# Patient Record
Sex: Female | Born: 1935 | Race: White | Hispanic: No
Health system: Southern US, Community
[De-identification: ages and names within clinical notes are randomized; demographics above are authoritative.]

---

## 2005-01-23 ENCOUNTER — Other Ambulatory Visit: Payer: Self-pay

## 2005-01-23 ENCOUNTER — Emergency Department: Payer: Self-pay | Admitting: Emergency Medicine

## 2005-04-12 ENCOUNTER — Emergency Department: Payer: Self-pay | Admitting: Emergency Medicine

## 2005-04-13 ENCOUNTER — Ambulatory Visit: Payer: Self-pay | Admitting: Emergency Medicine

## 2006-08-07 ENCOUNTER — Ambulatory Visit: Payer: Self-pay | Admitting: Family Medicine

## 2006-09-09 ENCOUNTER — Ambulatory Visit: Payer: Self-pay | Admitting: Family Medicine

## 2007-03-11 ENCOUNTER — Ambulatory Visit: Payer: Self-pay | Admitting: Family Medicine

## 2007-04-24 ENCOUNTER — Ambulatory Visit: Payer: Self-pay | Admitting: Family Medicine

## 2008-01-24 ENCOUNTER — Other Ambulatory Visit: Payer: Self-pay

## 2008-01-25 ENCOUNTER — Inpatient Hospital Stay: Payer: Self-pay | Admitting: Internal Medicine

## 2008-06-10 ENCOUNTER — Inpatient Hospital Stay: Payer: Self-pay | Admitting: Internal Medicine

## 2008-06-10 ENCOUNTER — Other Ambulatory Visit: Payer: Self-pay

## 2008-07-14 ENCOUNTER — Inpatient Hospital Stay: Payer: Self-pay | Admitting: Internal Medicine

## 2008-08-24 ENCOUNTER — Ambulatory Visit: Payer: Self-pay | Admitting: Internal Medicine

## 2010-04-10 ENCOUNTER — Inpatient Hospital Stay: Payer: Self-pay | Admitting: Internal Medicine

## 2010-05-29 ENCOUNTER — Emergency Department: Payer: Self-pay | Admitting: Emergency Medicine

## 2010-11-07 ENCOUNTER — Ambulatory Visit: Payer: Self-pay | Admitting: Family Medicine

## 2011-08-16 ENCOUNTER — Emergency Department: Payer: Self-pay | Admitting: Emergency Medicine

## 2011-09-03 ENCOUNTER — Ambulatory Visit: Payer: Self-pay | Admitting: Internal Medicine

## 2011-09-22 LAB — COMPREHENSIVE METABOLIC PANEL
Albumin: 3.3 g/dL — ABNORMAL LOW (ref 3.4–5.0)
Alkaline Phosphatase: 62 U/L (ref 50–136)
BUN: 30 mg/dL — ABNORMAL HIGH (ref 7–18)
Chloride: 103 mmol/L (ref 98–107)
EGFR (African American): 50 — ABNORMAL LOW
EGFR (Non-African Amer.): 41 — ABNORMAL LOW
Glucose: 99 mg/dL (ref 65–99)
Osmolality: 295 (ref 275–301)
Potassium: 3.5 mmol/L (ref 3.5–5.1)
SGOT(AST): 31 U/L (ref 15–37)
SGPT (ALT): 20 U/L
Sodium: 145 mmol/L (ref 136–145)
Total Protein: 6.7 g/dL (ref 6.4–8.2)

## 2011-09-22 LAB — URINALYSIS, COMPLETE
Bilirubin,UR: NEGATIVE
Glucose,UR: NEGATIVE mg/dL (ref 0–75)
Ketone: NEGATIVE
Nitrite: POSITIVE
RBC,UR: 4 /HPF (ref 0–5)
Squamous Epithelial: <1
WBC UR: 193 /HPF (ref 0–5)

## 2011-09-22 LAB — PROTIME-INR
INR: 1
Prothrombin Time: 13.9 secs (ref 11.5–14.7)

## 2011-09-22 LAB — CBC
HCT: 38.8 % (ref 35.0–47.0)
MCV: 94 fL (ref 80–100)
Platelet: 160 10*3/uL (ref 150–440)

## 2011-09-23 ENCOUNTER — Inpatient Hospital Stay: Payer: Self-pay | Admitting: *Deleted

## 2011-09-23 LAB — BASIC METABOLIC PANEL
BUN: 26 mg/dL — ABNORMAL HIGH (ref 7–18)
Chloride: 105 mmol/L (ref 98–107)
Co2: 30 mmol/L (ref 21–32)
EGFR (Non-African Amer.): 53 — ABNORMAL LOW
Glucose: 117 mg/dL — ABNORMAL HIGH (ref 65–99)
Osmolality: 294 (ref 275–301)
Potassium: 3.7 mmol/L (ref 3.5–5.1)

## 2011-09-23 LAB — TSH: Thyroid Stimulating Horm: 2.58 u[IU]/mL

## 2011-09-24 LAB — COMPREHENSIVE METABOLIC PANEL
Albumin: 2.8 g/dL — ABNORMAL LOW (ref 3.4–5.0)
Alkaline Phosphatase: 53 U/L (ref 50–136)
Anion Gap: 12 (ref 7–16)
Bilirubin,Total: 1.2 mg/dL — ABNORMAL HIGH (ref 0.2–1.0)
Calcium, Total: 9.9 mg/dL (ref 8.5–10.1)
Chloride: 107 mmol/L (ref 98–107)
Co2: 28 mmol/L (ref 21–32)
EGFR (African American): >60
EGFR (Non-African Amer.): 52 — ABNORMAL LOW
Glucose: 92 mg/dL (ref 65–99)
Potassium: 3.7 mmol/L (ref 3.5–5.1)
SGOT(AST): 29 U/L (ref 15–37)
SGPT (ALT): 17 U/L
Total Protein: 5.7 g/dL — ABNORMAL LOW (ref 6.4–8.2)

## 2011-09-24 LAB — URINE CULTURE

## 2011-09-24 LAB — CBC WITH DIFFERENTIAL/PLATELET
Basophil #: 0 10*3/uL (ref 0.0–0.1)
Basophil %: 0.2 %
Eosinophil #: 0 10*3/uL (ref 0.0–0.7)
HCT: 37.7 % (ref 35.0–47.0)
HGB: 12.4 g/dL (ref 12.0–16.0)
Lymphocyte #: 0.9 10*3/uL — ABNORMAL LOW (ref 1.0–3.6)
MCHC: 32.8 g/dL (ref 32.0–36.0)
MCV: 95 fL (ref 80–100)
Monocyte %: 12.7 %
Neutrophil #: 4.8 10*3/uL (ref 1.4–6.5)
RDW: 15.2 % — ABNORMAL HIGH (ref 11.5–14.5)
WBC: 6.6 10*3/uL (ref 3.6–11.0)

## 2011-09-28 LAB — CULTURE, BLOOD (SINGLE)

## 2011-10-04 ENCOUNTER — Ambulatory Visit: Payer: Self-pay | Admitting: Internal Medicine

## 2011-10-04 DEATH — deceased

## 2012-05-01 IMAGING — CT CT HEAD WITHOUT CONTRAST
2 of 4 series · 16 of 30 positions shown, 19 images · non-contrast
Comparison: none

REASON FOR EXAM: ams following trauma
COMMENTS:

PROCEDURE:     CT  - CT HEAD WITHOUT CONTRAST  - September 22, 2011  [DATE]
RESULT:     Comparison:  None
TECHNIQUE: Multiple axial images from the foramen magnum to the vertex were
obtained without IV contrast.

[Series 2: without · axial · non-contrast · 0.44mm/px · z∈[+322,+452]mm · 10 of 32 slices shown, 13 images]
[im 3/32  brain]
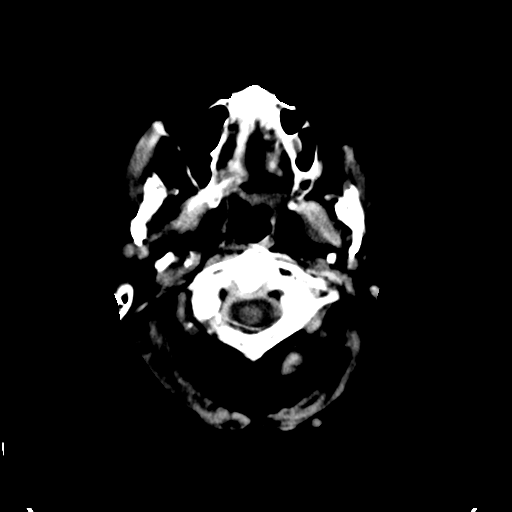
[im 3/32  bone]
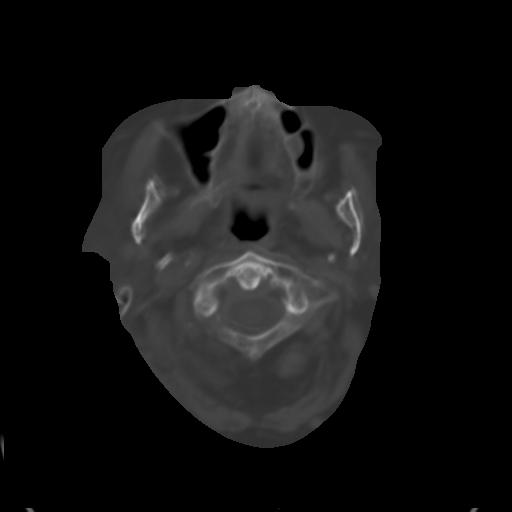
[im 6/32  brain]
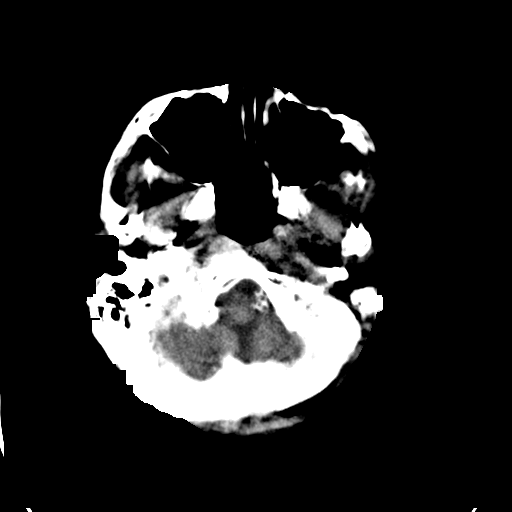
[im 9/32  brain]
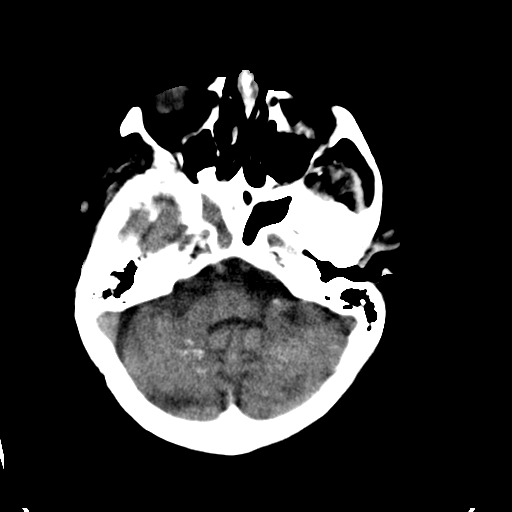
[im 12/32  brain]
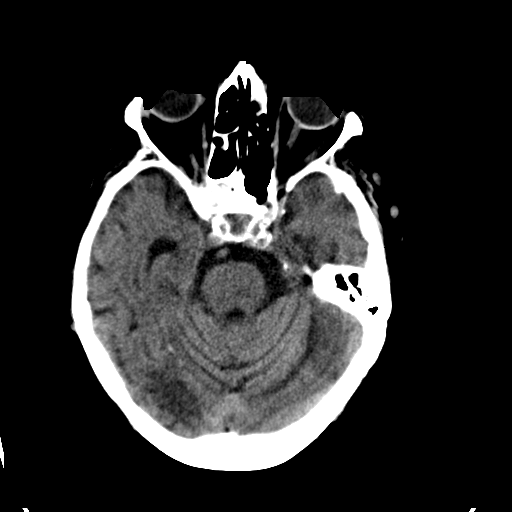
[im 15/32  brain]
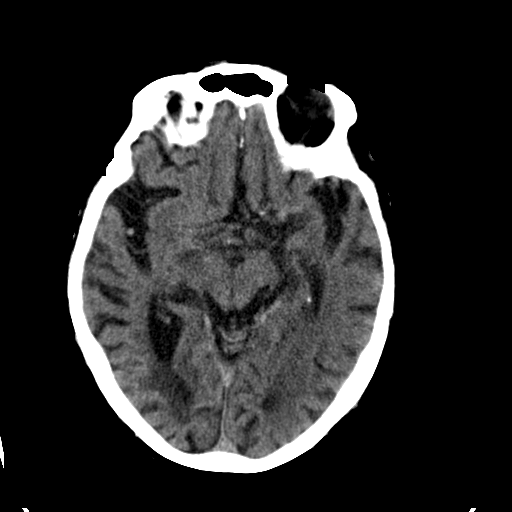
[im 15/32  bone]
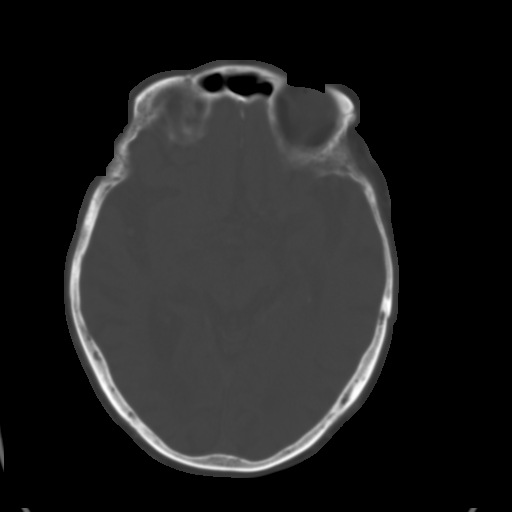
[im 17/32  brain]
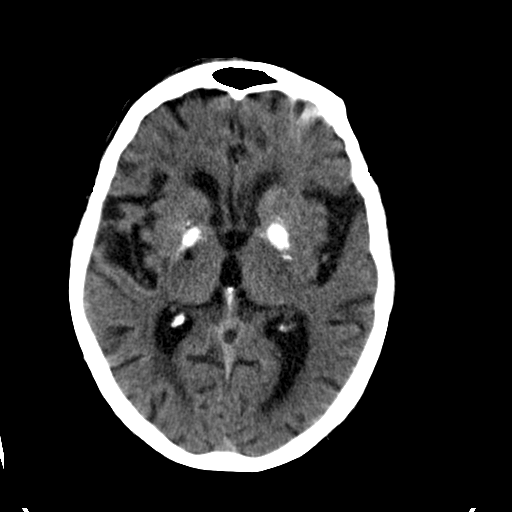
[im 20/32  brain]
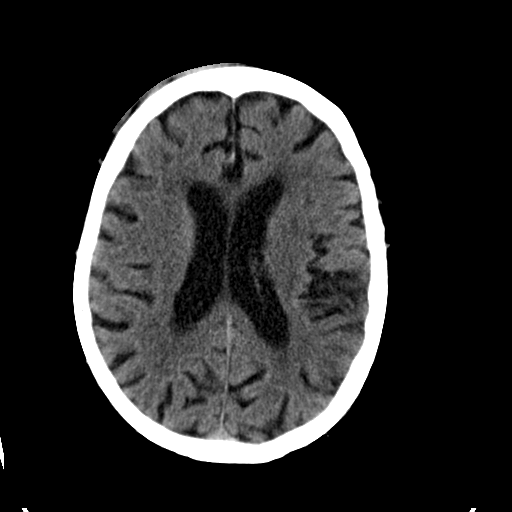
[im 23/32  brain]
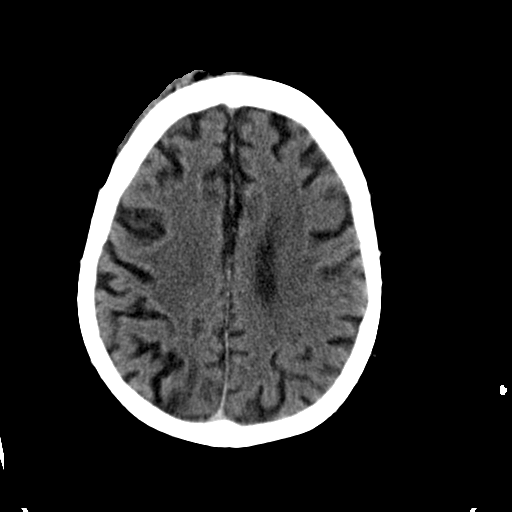
[im 26/32  brain]
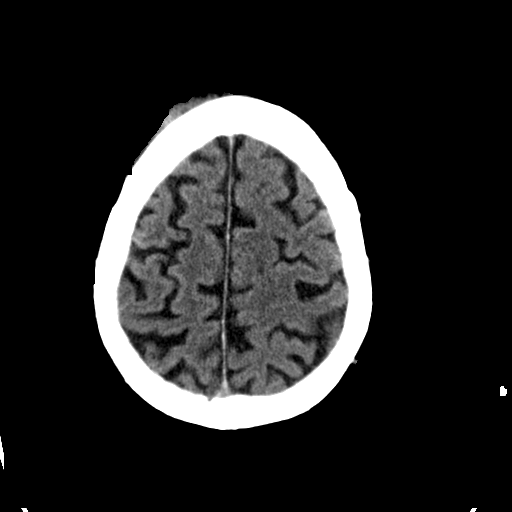
[im 26/32  bone]
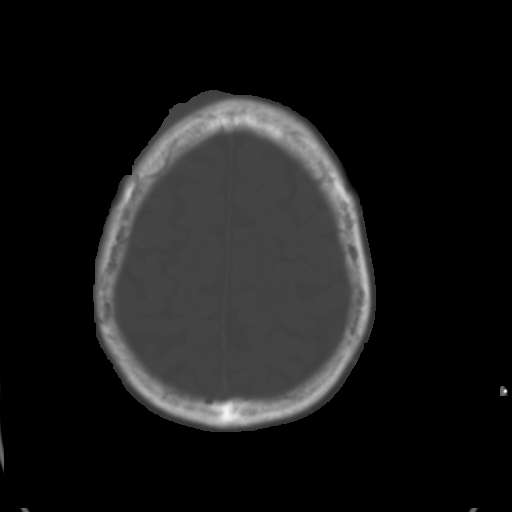
[im 29/32  brain]
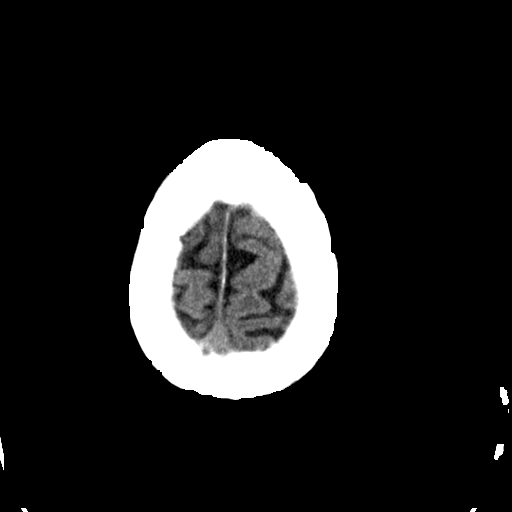

[Series 3: bone · axial · 0.44mm/px · z∈[+322,+412]mm · 6 of 33 slices shown]
[im 3/33  bone]
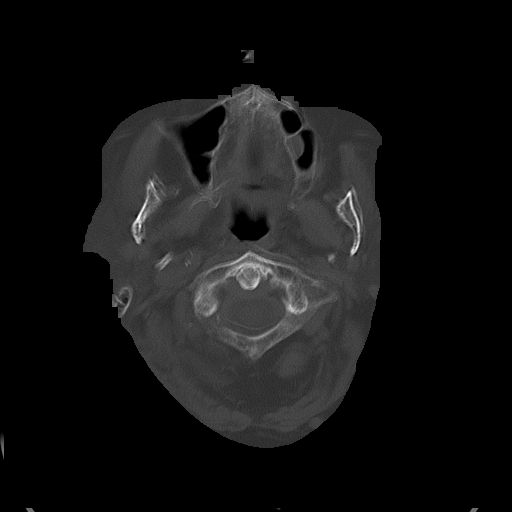
[im 6/33  bone]
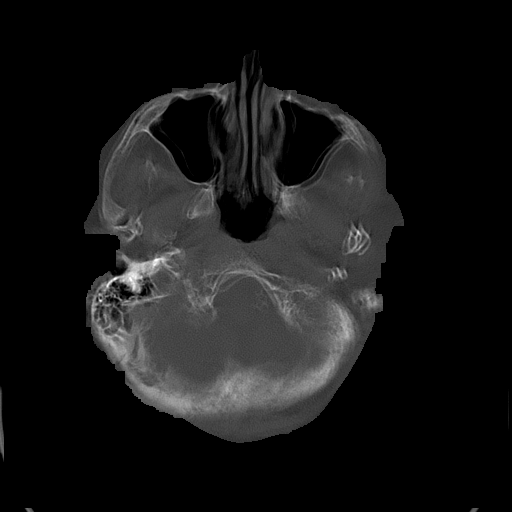
[im 12/33  bone]
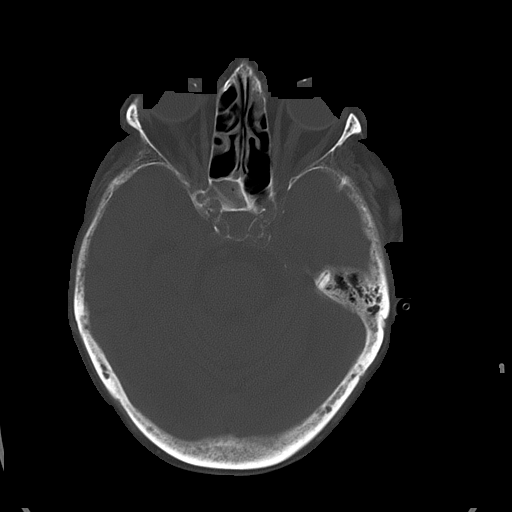
[im 15/33  bone]
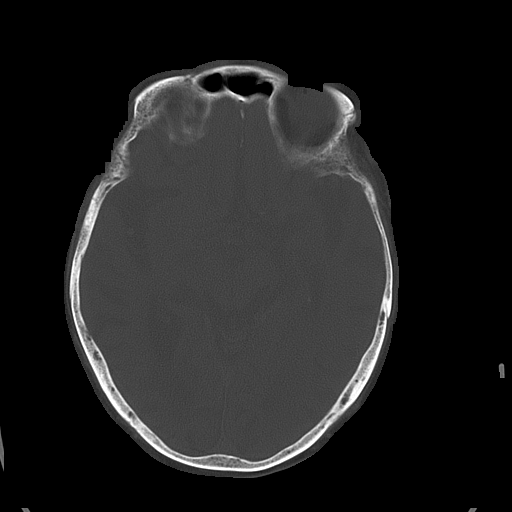
[im 18/33  bone]
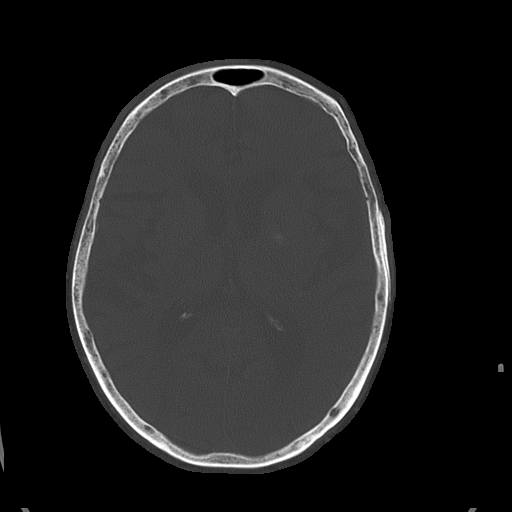
[im 21/33  bone]
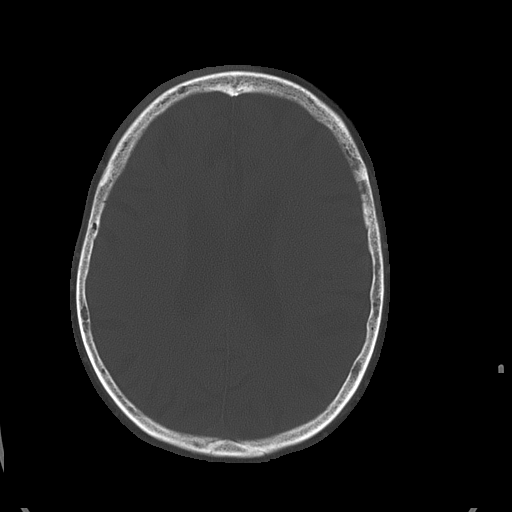

[16 of 30 positions shown; findings below may reference images not displayed]

FINDINGS: There is no evidence of mass effect, midline shift, or extra-axial fluid
collections.  There is no evidence of a space-occupying lesion or
intracranial hemorrhage. There is no evidence of a cortical-based area of
acute infarction. There is generalized cerebral atrophy. There is
periventricular white matter low attenuation likely secondary to
microangiopathy.

The ventricles and sulci are appropriate for the patient's age. The basal
cisterns are patent.

Visualized portions of the orbits are unremarkable. There is right sphenoid
sinus opacification. Cerebrovascular atherosclerotic calcifications are
noted.

The osseous structures are unremarkable. There is a large right frontal
scalp hematoma.
IMPRESSION: No acute intracranial process.

## 2012-05-02 IMAGING — CR DG CHEST 1V PORT
1 series · 2 of 2 positions shown · non-contrast
Comparison: none

REASON FOR EXAM: pain following trauma
COMMENTS:

PROCEDURE:     DXR - DXR PORTABLE CHEST SINGLE VIEW  - September 23, 2011 [DATE]
RESULT:
Cardiomegaly is present with pulmonary vascular prominence. These findings
are consistent with congestive heart failure. No evidence of overt of
pulmonary edema. Minimal interstitial prominence noted.

[Series 3: t chest supine · 0.14mm/px · 2 of 2 slices shown]
[im 1/2]
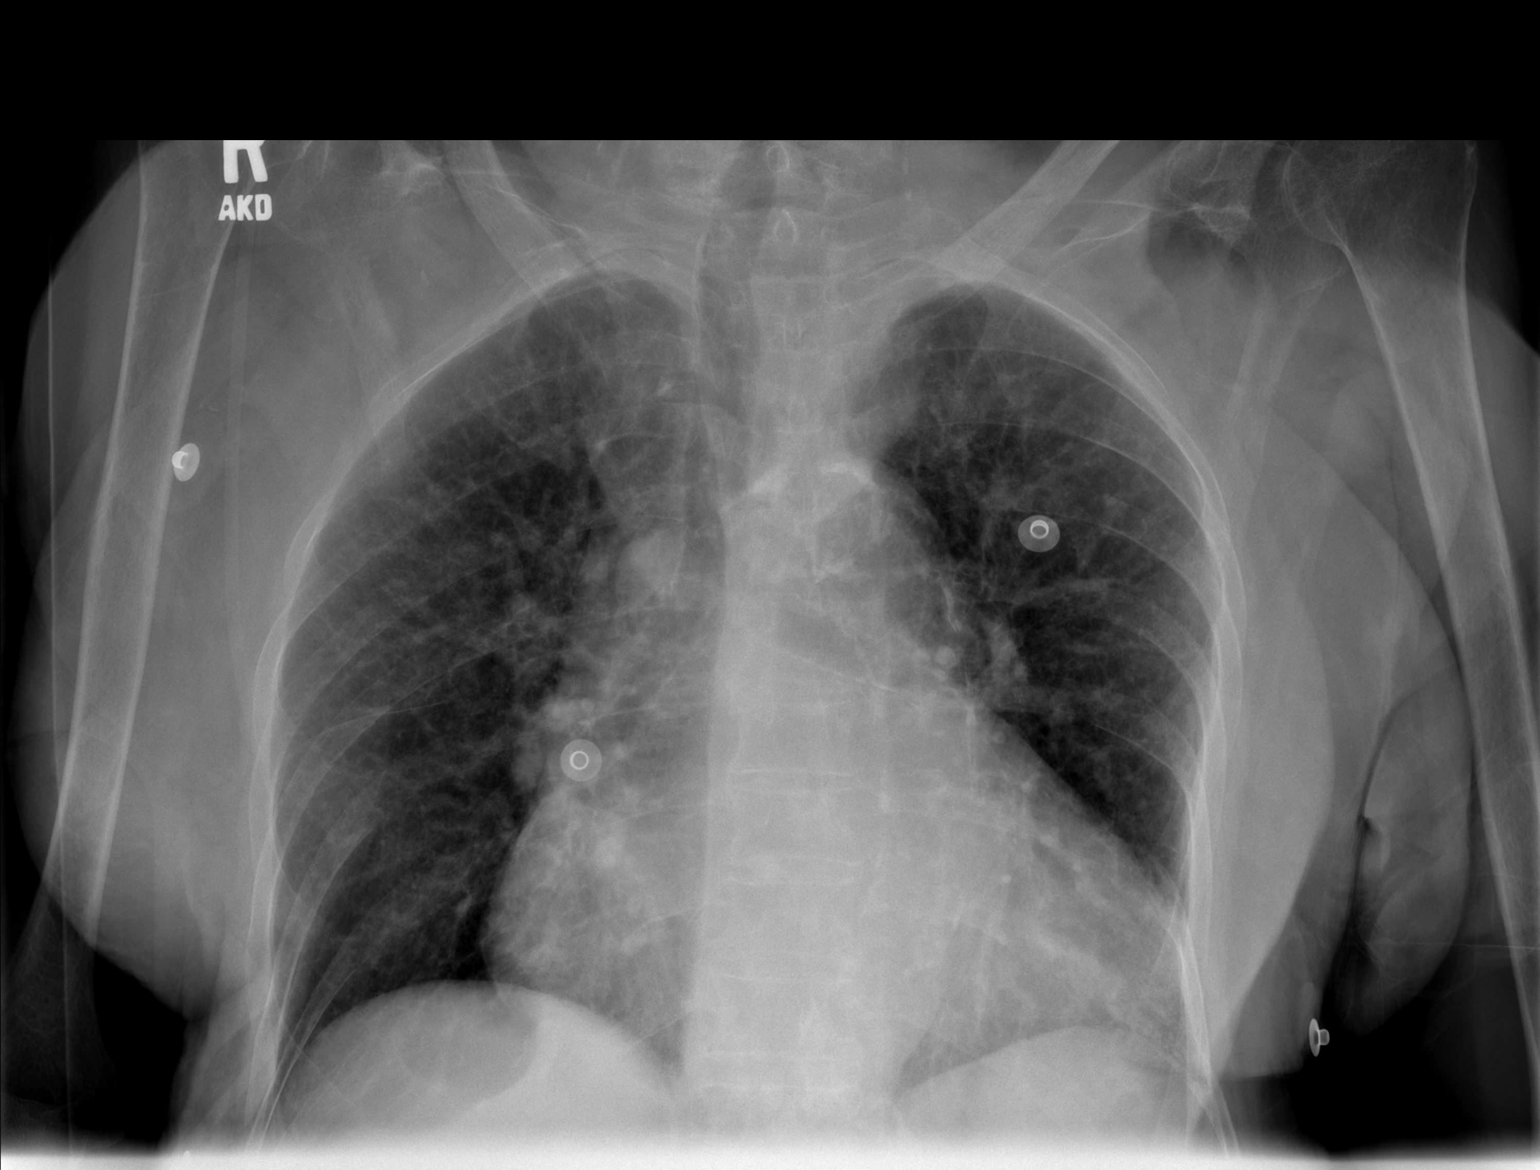
[im 2/2]
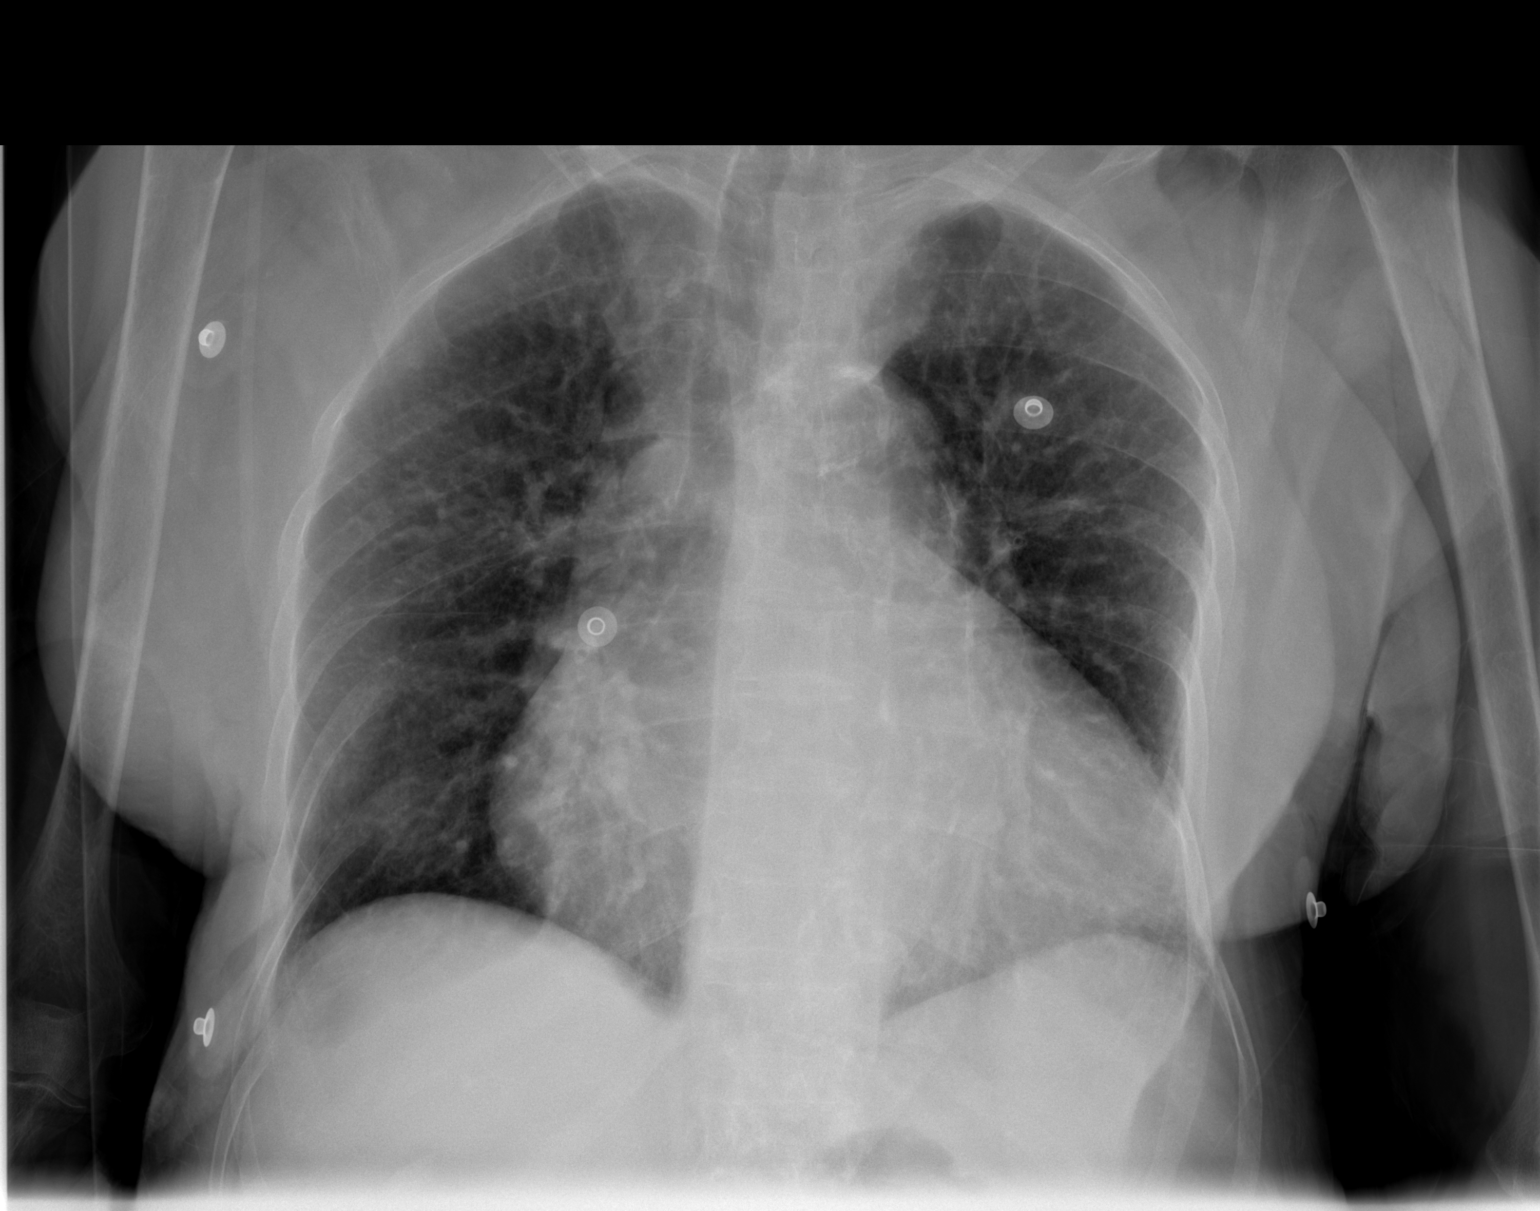

[2 of 2 positions shown; findings below may reference images not displayed]

IMPRESSION: Cardiomegaly with pulmonary venous congestion. No evidence
of overt pulmonary edema. No focal infiltrates. No displaced rib fractures
or pneumothorax.

## 2014-12-25 NOTE — Discharge Summary (Signed)
PATIENT NAME:  Shelia Carlson, Shelia Carlson MR#:  [id number] DATE OF BIRTH:  1917-03-08  DATE OF ADMISSION:  09/23/2011 DATE OF DISCHARGE:  09/24/2011  DISCHARGE DIAGNOSES:  1. Altered mental status, encephalopathy secondary to urinary tract infection.  2. ESBL Escherichia coli urinary tract infection. 3. Dehydration.  4. Renal insufficiency, status post fall with right forehead hematoma.  5. Diabetes.  6. Hypertension.  7. Hypothyroidism.  8. Dementia.  9. History of congestive heart failure. 10. Chronic obstructive pulmonary disease. 11. History of recurrent UTIs. 12. History of peripheral vascular disease.  13. Hypothyroidism.  CONSULT: Palliative Care    HOSPITAL COURSE: The patient is a 81 year old female who lives at [facility]. She has history of diabetes, hypertension, chronic obstructive pulmonary disease, coronary artery disease, history of congestive heart failure, recurrent urinary tract infections, peripheral vascular disease, hypothyroidism, and dementia. [facility] going [facility] at [facility]. She presented after a fall. She was unable to provide any history in the Emergency Room. She is wheelchair-bound. She had a laceration of the right side of the forehead. She was found with altered mental status. She was also found to be having significant urinary tract infection with urinalysis showing 3+ leukocyte esterase and 193 WBCs per high-powered field. She was empirically started on IV Levaquin. The family was interested in Hospice services. The patient was admitted as altered mental status and urinary tract infection, significantly dehydrated. The patient was not tolerating anything p.o., not even p.o. medications. She was encephalopathic. She had a CT of the head done at admission which was essentially negative. She also had a CT of the cervical spine which did not show any acute fracture of the cervical spine, only age-related degenerative changes.   Her white count when she came in was normal  at 5.3, hemoglobin 13.3. Creatinine was 1.28 with BUN of 30. She was given some IV hydration. Today her sodium went up to 147, BUN 22, creatinine 1.06. Her urine culture today came back as Escherichia coli extended spectrum beta-lactamase sensitive only to carbapenems. She was started on ertapenem but the family talked with Palliative Care and they decided on comfort measures and Hospice home. The patient will not be receiving any IV antibiotics for urinary tract infection which I have informed the family . There is no oral antibiotic that is effective against ESBL Escherichia coli. Currently the patient is not taking anything p.o. I've also held her Lasix at the Hospice home because the patient is already dehydrated and not taking anything p.o.   MEDICATIONS AT DISCHARGE:  1. Roxanol 20 mg/mL 0.25 to 0.5 mL q.1 to 2 hours p.Carlson.n. 2. Lorazepam 0.5 to 1 mg p.o. sublingual q.2 to 4 hours. 3. Ranitidine 150 mg.  4. ABHR suppository q.4 to 6 hours p.Carlson.n.  5. Spiriva HandiHaler 1 puff once daily.  6. Levofloxacin 50 mcg daily.  7. Carvedilol 6.25 mg b.i.d.  8. Advair Diskus 250/50 b.i.d.  9. DuoNebs 3 times a day as needed for congestion or wheezing.   Her other medications have been stopped because now she is now COMFORT MEASURES. I stopped her Lasix and glipizide because she is not tolerating anything p.o. She is already dehydrated.  DISCHARGE INSTRUCTIONS: Leave Foley catheter in for urinary incontinence and prevention of skin breakdown.  DIET: As tolerated. May crush or use liquids when appropriate. May change to  rectal route if unable to swallow.   ACTIVITY: As tolerated.    PROGNOSIS: The patient has a very poor prognosis. The patient remains minimally  responsive. She is not eating.  T-max 98, heart rate 72, blood pressure 167/85, saturating 99% on two liters nasal cannula. She is in an obtunded state, minimally responsive, only moves her head with some verbal stimuli. She has significant  bruising on the forehead area, right forehead hematoma. Oral mucosa is completely dry. Chest is clear. Heart sounds are regular. Poor skin turgor. Emaciated state.   TIME SPENT ON DISCHARGE: 45 minutes.   ____________________________ Fredia Sorrow, MD ag:drc D: 09/24/2011 12:58:43 ET T: 09/24/2011 13:46:29 ET JOB#: [id number]  cc: Fredia Sorrow, MD, <Dictator> [facility] Fredia Sorrow MD ELECTRONICALLY SIGNED 10/24/2011 13:45

## 2014-12-25 NOTE — H&P (Signed)
PATIENT NAME:  Shelia Carlson, Shelia Carlson MR#:  [id number] DATE OF BIRTH:  1916/10/01  DATE OF ADMISSION:  09/23/2011  REFERRING PHYSICIAN: Graciella Freer, MD  REASON FOR ADMISSION: Altered mental status, urinary tract infection.   HISTORY OF PRESENT ILLNESS: The patient is a 81 year old Caucasian female with past medical history of diabetes mellitus, chronic obstructive pulmonary disease, coronary artery disease, history of congestive heart failure, recurrent urinary tract infections, peripheral vascular disease, hypertension, hyperlipidemia, pernicious anemia, and hypothyroidism who presented to [facility] status post fall. The patient is currently unable to offer any history at this point in time and history is obtained through chart review as well as interview of the patient's daughters. The patient's daughter reports to me that she was called at approximately 7 o'clock  p.m. tonight and was told that her mother fell. She sustained a significant laceration to the right side of her forehead. She currently has altered mental status and is unable to provide any history at this point in time. It appears that the patient has history of recurrent urinary tract infections and she appears to have significant pyuria as well as 3+ bacteruria on a urinalysis. The patient was administered ciprofloxacin. The patient presented to [facility] on 08/16/2011 with another urinary tract infection at that point in time. She was evaluated by Shelia Carlson of palliative care at that point in time. The family was interested in hospice services at that time. The patient was discharged back to the nursing home at [facility] at that point in time; however, the patient subsequently improved and the family did not make a referral to hospice at that time. The patient's family is interested in hospice services at this point in time. It appears that hospice services are not available at [facility] at present.  The patient is being treated for further evaluation and medical treatment at this time.   PAST MEDICAL HISTORY:  1. Diabetes mellitus.  2. Hypertension. 3. Chronic obstructive pulmonary disease. 4. Coronary artery disease.  5. History of congestive heart failure, followed by Dr. Saralyn Carlson. 6. Recurrent urinary tract infections.  7. Peripheral vascular disease, status post left lower extremity bypass.  8. Hyperlipidemia.  9. Pernicious anemia.  10. Hypothyroidism.   PAST SURGICAL HISTORY:  1. Appendectomy.  2. Hysterectomy.   ALLERGIES: Aspirin.  MEDICATIONS: (From the nursing home.) 1. Zoloft 50 mg p.o. daily. 2. Spiriva 18 mcg inhaled daily. 3. Levothyroxine 50 mcg p.o. daily. 4. Glipizide 5 mg p.o. daily.  5. Spironolactone 25 mg p.o. daily.  6. Vitamin D3 1000 international units p.o. daily.  7. Omeprazole 20 mg p.o. daily.  8. Furosemide 40 mg p.o. daily.  9. Vitamin B12 1,000 mcg p.o. once a week. 10. Mucinex 600 mg p.o. twice a day. 11. Coreg 6.25 mg p.o. twice a day. 12. Advair 250/50 one puff inhaled twice a day. 13. Colace 1 capsule p.o. daily.  14. Ferrous sulfate 325 mg p.o. twice a day. 15. Albuterol ipratropium nebulizers  three times daily as needed.  16. Tylenol 325 mg tablets, 650 mg p.o. every four hours p.r.n.  17. Aricept 10 mg p.o. daily.  18. Ceftin 250 mg p.o. twice a day. 19. Colace 100 mg p.o. twice a day.  SOCIAL HISTORY: The patient currently resides at [facility]. She has two daughters and two sons. She used to smoke in the past but quit in 1991. No alcohol or illicit drug use.   FAMILY HISTORY: Significant for hypertension and diabetes mellitus.  The patient's daughter also has a history of back surgery.   REVIEW OF SYSTEMS: Currently unobtainable from the patient as she has altered mental status.  PHYSICAL EXAMINATION:   VITAL SIGNS: Pulse 70, respirations 18, blood pressure 167/75, and pulse oximetry 99% on oxygen.   GENERAL:  Elderly frail-appearing female who is currently lying on a gurney in the emergency department.  HEENT: The patient has a significant right forehead hematoma as well as laceration overlying the hematoma. Pupils are equal, round, and reactive to light. No scleral icterus. Conjunctivae are pink. No epistaxis noted. Oral mucosa are quite dry.   NECK: Supple and without JVD or lymphadenopathy.   LUNGS: Lungs demonstrate mild bilateral wheezing, but normal respiratory effort.   HEART: S1 and S2 regular rate and rhythm. No murmurs or rubs appreciated.   ABDOMEN: Soft, nontender, and nondistended. Bowel sounds positive. No rebound or guarding. No gross organomegaly is appreciated.   EXTREMITIES: No clubbing, cyanosis, or edema is noted.   NEUROLOGIC: The patient is arousable, but is not following commands at this point in time.   SKIN: Warm and dry. The patient has numerous ecchymoses on the upper and lower extremities.   MUSCULOSKELETAL: The patient has bruising about her left knee as well as left hand.   GU: Foley catheter is noted to be in place.   PSYCHIATRIC: Unable to assess at this time.   LABS/STUDIES: Sodium 145, potassium 3.5, chloride 103, CO2 29, BUN 30, creatinine 1.28, glucose 99, eGFR 41, calcium 10.5, total protein 6.7, albumin 3.3, total bilirubin 0.8, alkaline phosphatase 62, AST 31, ALT 20. Troponin 0.03 CBC shows WBC 5.3, hemoglobin 13.3, hematocrit 38, platelets 160. INR 1.0.   Urinalysis demonstrates 193 WBCs per high-power field, 4 RBCs per high-power field, and 3+ bacteria.  IMPRESSION: This is a 81 year old Caucasian female with past medical history of hypertension, diabetes mellitus, chronic obstructive pulmonary disease, coronary artery disease, history of congestive heart failure, recurrent urinary tract infections, peripheral vascular disease status post left lower extremity bypass, hyperlipidemia, pernicious anemia, and hypothyroidism who presents to [facility] status post fall from a chair today as well as urinary tract infection, dehydration, and altered mental status.   1. Altered mental status: This appears to be multifactorial with contributions from dehydration and urinary tract infection. This also likely led to her fall. The patient did have a head CT performed which showed right frontal scalp swelling and hematoma, but no acute intracranial pathology noted. The patient also had a CT scan of the cervical spine which was negative for any acute findings. We will continue to monitor her mental status for now. It appears that the patient has fairly advanced baseline dementia as well. We will see if her mental status improves with treatment of dehydration and urinary tract infection.  2. Urinary tract infection: The patient was on cefoxitin at the nursing home for prevention; however, she appears to have significant bacteriuria and pyuria at this point in time. We will continue the patient on fluoroquinolone with Levaquin 500 mg IV daily for now.  3. Dehydration: The patient has an elevated BUN to creatinine ratio. BUN was high at 30 with a creatinine of 1.28. The patient was noted to be on Lasix, which we will hold. We will also provide gentle hydration with 0.9 normal saline at 50 mL/hour given her history of underline congestive heart failure.  4. Right forehead hematoma: The patient has a right forehead hematoma and overlying this is a 2.5 cm  in diameter laceration. Local wound care is recommended for this for now.  5. Chronic obstructive pulmonary disease: We will continue Spiriva, nebulizers as needed, and Advair. We will also provide the patient with supplemental oxygen to maintain an oxygen saturation between 88 to 92%.  6. Dementia: For now we will continue the patient on Aricept 10 mg p.o. every a.m.  7. History of congestive heart failure: The patient is followed by Dr. Saralyn Carlson as an outpatient. For now we will continue the  patient on Coreg 6.25 mg p.o. twice a day. We will hold Lasix and spironolactone, given the dehydration, for now.  8. Diabetes mellitus: We will hold glipizide for now and provide the patient with sliding scale insulin.  9. Hypothyroidism: We will check a TSH. Continue the patient on Synthroid 50 mcg p.o. daily.   CODE STATUS: The patient is DO NOT RESUSCITATE, from the nursing home. I confirmed this with the patient's daughters. I also a long discussion about her overall health condition. It appears she has had slow progressive decline. She was seen by palliative care on 08/16/2011. She was felt to be appropriate for hospice services at that time; however, it appears that hospice services are unavailable at [facility]. The patient's family continues to be quite interested in this particularly given today's events. We will consult palliative care as well as care management for further assistance regarding this issue.   TIME SPENT: Greater than one hour. ____________________________ Tama High, MD mnl:slb D: 09/23/2011 03:01:31 ET T: 09/23/2011 10:21:40 ET JOB#: [id number]  cc: Tama High, MD, <Dictator> Mariah Milling Orvell Careaga MD ELECTRONICALLY SIGNED 10/11/2011 20:43
